# Patient Record
Sex: Male | Born: 1975 | Race: White | Hispanic: No | Marital: Married | Smoking: Never smoker
Health system: Southern US, Community
[De-identification: ages and names within clinical notes are randomized; demographics above are authoritative.]

## PROBLEM LIST (undated history)

## (undated) DIAGNOSIS — I10 Essential (primary) hypertension: Secondary | ICD-10-CM

---

## 2014-05-31 ENCOUNTER — Ambulatory Visit: Payer: Self-pay | Admitting: Internal Medicine

## 2014-05-31 LAB — COMPREHENSIVE METABOLIC PANEL
ALK PHOS: 70 U/L
ALT: 61 U/L
ANION GAP: 10 (ref 7–16)
Albumin: 4.1 g/dL (ref 3.4–5.0)
BILIRUBIN TOTAL: 0.6 mg/dL (ref 0.2–1.0)
BUN: 11 mg/dL (ref 7–18)
CALCIUM: 9.4 mg/dL (ref 8.5–10.1)
CREATININE: 1.02 mg/dL (ref 0.60–1.30)
Chloride: 103 mmol/L (ref 98–107)
Co2: 28 mmol/L (ref 21–32)
Glucose: 120 mg/dL — ABNORMAL HIGH (ref 65–99)
Osmolality: 282 (ref 275–301)
Potassium: 3.8 mmol/L (ref 3.5–5.1)
SGOT(AST): 23 U/L (ref 15–37)
SODIUM: 141 mmol/L (ref 136–145)
TOTAL PROTEIN: 8.1 g/dL (ref 6.4–8.2)

## 2014-05-31 LAB — LIPASE, BLOOD: LIPASE: 96 U/L (ref 73–393)

## 2014-05-31 LAB — CBC WITH DIFFERENTIAL/PLATELET
BASOS ABS: 0.1 10*3/uL (ref 0.0–0.1)
BASOS PCT: 0.9 %
EOS ABS: 0 10*3/uL (ref 0.0–0.7)
Eosinophil %: 0.4 %
HCT: 49.3 % (ref 40.0–52.0)
HGB: 16.5 g/dL (ref 13.0–18.0)
LYMPHS ABS: 2 10*3/uL (ref 1.0–3.6)
LYMPHS PCT: 18 %
MCH: 29.1 pg (ref 26.0–34.0)
MCHC: 33.5 g/dL (ref 32.0–36.0)
MCV: 87 fL (ref 80–100)
Monocyte #: 0.7 x10 3/mm (ref 0.2–1.0)
Monocyte %: 6.5 %
NEUTROS PCT: 74.2 %
Neutrophil #: 8.4 10*3/uL — ABNORMAL HIGH (ref 1.4–6.5)
Platelet: 258 10*3/uL (ref 150–440)
RBC: 5.67 10*6/uL (ref 4.40–5.90)
RDW: 12.9 % (ref 11.5–14.5)
WBC: 11.3 10*3/uL — ABNORMAL HIGH (ref 3.8–10.6)

## 2014-05-31 LAB — AMYLASE: Amylase: 52 U/L (ref 25–115)

## 2018-05-06 ENCOUNTER — Emergency Department
Admission: EM | Admit: 2018-05-06 | Discharge: 2018-05-06 | Disposition: A | Discharge status: Home / Self Care | Payer: 59 | Attending: Emergency Medicine | Admitting: Emergency Medicine

## 2018-05-06 ENCOUNTER — Emergency Department: Payer: 59

## 2018-05-06 ENCOUNTER — Encounter: Payer: Self-pay | Admitting: Emergency Medicine

## 2018-05-06 ENCOUNTER — Other Ambulatory Visit: Payer: Self-pay

## 2018-05-06 DIAGNOSIS — F17200 Nicotine dependence, unspecified, uncomplicated: Secondary | ICD-10-CM | POA: Diagnosis not present

## 2018-05-06 DIAGNOSIS — I10 Essential (primary) hypertension: Secondary | ICD-10-CM | POA: Diagnosis not present

## 2018-05-06 DIAGNOSIS — N23 Unspecified renal colic: Secondary | ICD-10-CM | POA: Diagnosis not present

## 2018-05-06 DIAGNOSIS — R1032 Left lower quadrant pain: Secondary | ICD-10-CM | POA: Diagnosis present

## 2018-05-06 HISTORY — DX: Essential (primary) hypertension: I10

## 2018-05-06 LAB — URINALYSIS, COMPLETE (UACMP) WITH MICROSCOPIC
Bilirubin Urine: NEGATIVE
KETONES UR: 80 mg/dL — AB
Leukocytes, UA: NEGATIVE
Nitrite: NEGATIVE
PROTEIN: NEGATIVE mg/dL
Specific Gravity, Urine: 1.014 (ref 1.005–1.030)
pH: 6 (ref 5.0–8.0)

## 2018-05-06 LAB — COMPREHENSIVE METABOLIC PANEL
ALT: 48 U/L — ABNORMAL HIGH (ref 0–44)
ANION GAP: 11 (ref 5–15)
AST: 38 U/L (ref 15–41)
Albumin: 4 g/dL (ref 3.5–5.0)
Alkaline Phosphatase: 60 U/L (ref 38–126)
BUN: 13 mg/dL (ref 6–20)
CHLORIDE: 104 mmol/L (ref 98–111)
CO2: 23 mmol/L (ref 22–32)
Calcium: 8.9 mg/dL (ref 8.9–10.3)
Creatinine, Ser: 0.78 mg/dL (ref 0.61–1.24)
GFR calc non Af Amer: >60 mL/min (ref >60)
Glucose, Bld: 262 mg/dL — ABNORMAL HIGH (ref 70–99)
POTASSIUM: 4 mmol/L (ref 3.5–5.1)
SODIUM: 138 mmol/L (ref 135–145)
Total Bilirubin: 1 mg/dL (ref 0.3–1.2)
Total Protein: 7.5 g/dL (ref 6.5–8.1)

## 2018-05-06 LAB — CBC WITH DIFFERENTIAL/PLATELET
ABS IMMATURE GRANULOCYTES: 0.11 10*3/uL — AB (ref 0.00–0.07)
Basophils Absolute: 0.1 10*3/uL (ref 0.0–0.1)
Basophils Relative: 0 %
Eosinophils Absolute: 0 10*3/uL (ref 0.0–0.5)
Eosinophils Relative: 0 %
HEMATOCRIT: 49 % (ref 39.0–52.0)
HEMOGLOBIN: 17 g/dL (ref 13.0–17.0)
Immature Granulocytes: 1 %
LYMPHS ABS: 1.1 10*3/uL (ref 0.7–4.0)
LYMPHS PCT: 7 %
MCH: 29.2 pg (ref 26.0–34.0)
MCHC: 34.7 g/dL (ref 30.0–36.0)
MCV: 84 fL (ref 80.0–100.0)
MONO ABS: 0.4 10*3/uL (ref 0.1–1.0)
MONOS PCT: 2 %
NEUTROS ABS: 14.5 10*3/uL — AB (ref 1.7–7.7)
NEUTROS PCT: 90 %
NRBC: 0 % (ref 0.0–0.2)
Platelets: 347 10*3/uL (ref 150–400)
RBC: 5.83 MIL/uL — AB (ref 4.22–5.81)
RDW: 12.1 % (ref 11.5–15.5)
WBC: 16.2 10*3/uL — ABNORMAL HIGH (ref 4.0–10.5)

## 2018-05-06 LAB — LIPASE, BLOOD: LIPASE: 28 U/L (ref 11–51)

## 2018-05-06 MED ORDER — OXYCODONE-ACETAMINOPHEN 5-325 MG PO TABS: 1.0000 | ORAL_TABLET | ORAL | 0 refills | Status: AC | PRN

## 2018-05-06 MED ORDER — MORPHINE SULFATE (PF) 4 MG/ML IV SOLN
4.0000 mg | Freq: Once | INTRAVENOUS | Status: AC
  Administered 2018-05-06: 4 mg via INTRAVENOUS

## 2018-05-06 MED ORDER — SODIUM CHLORIDE 0.9 % IV BOLUS
1000.0000 mL | Freq: Once | INTRAVENOUS | Status: AC
  Administered 2018-05-06: 1000 mL via INTRAVENOUS

## 2018-05-06 MED ORDER — ONDANSETRON HCL 4 MG/2ML IJ SOLN
INTRAMUSCULAR | Status: AC
  Filled 2018-05-06: qty 2

## 2018-05-06 MED ORDER — ONDANSETRON HCL 4 MG/2ML IJ SOLN
4.0000 mg | Freq: Once | INTRAMUSCULAR | Status: AC
  Administered 2018-05-06: 4 mg via INTRAVENOUS

## 2018-05-06 MED ORDER — MORPHINE SULFATE (PF) 4 MG/ML IV SOLN
INTRAVENOUS | Status: AC
  Filled 2018-05-06: qty 1

## 2018-05-06 NOTE — ED Triage Notes (Signed)
Pt presents to ED via [facility] with c/o L flank pain that started this morning. Pt denies hx of kidney stone. Per EMS pt attempted to go POV to hospital due to pain, per EMS pt had syncopal episode in front of [facility], pt was immediately arrousable. EMS also reports frequent episodes of hyperventilation

## 2018-05-06 NOTE — Discharge Instructions (Addendum)
Please call Dr. Apolinar Junes the urologist to arrange follow-up later on this week or next week.  Please return here for fever vomiting or much worse pain.  You can use the Percocet 1 pill 4 times a day as needed for pain or if need be go up to 2 pills 4 times a day.  Be careful it can make you woozy.  Do not drive on it.  Can also make you constipated.  Eat plenty of fiber and lots of fluid.

## 2018-05-06 NOTE — ED Provider Notes (Signed)
[facility] Emergency Department Provider Note   ____________________________________________   First MD Initiated Contact with Patient 05/06/18 1512     (approximate)  I have reviewed the triage vital signs and the nursing notes.   HISTORY  Chief Complaint Flank Pain and Loss of Consciousness   HPI Drew Dennis. is a 42 y.o. male reports left flank pain starting this morning radiating around toward the groin.  No dysuria.  Pain is worse with movement or deep breathing.  Is also worse with question of the left CVA area.  Patient reportedly passed out in front of neb in urgent care but EMS reports he woke up easily immediately.  Patient does look somewhat pale and his breathing somewhat fast.  Pain is severe to moderately severe deep and achy.   Past Medical History:  Diagnosis Date  . Hypertension     There are no active problems to display for this patient.   History reviewed. No pertinent surgical history.  Prior to Admission medications   Medication Sig Start Date End Date Taking? Authorizing Provider  oxyCODONE-acetaminophen (PERCOCET) 5-325 MG tablet Take 1 tablet by mouth every 4 (four) hours as needed for severe pain. 05/06/18 05/06/19  Arnaldo Natal, MD    Allergies Sudafed [pseudoephedrine]  History reviewed. No pertinent family history.  Social History Social History   Tobacco Use  . Smoking status: Never Smoker  . Smokeless tobacco: Former Engineer, water Use Topics  . Alcohol use: Not Currently  . Drug use: Never    Review of Systems  Constitutional: No fever/chills Eyes: No visual changes. ENT: No sore throat. Cardiovascular: Denies chest pain. Respiratory: Denies shortness of breath. Gastrointestinal: No abdominal pain.  No nausea, no vomiting.  No diarrhea.  No constipation. Genitourinary: Negative for dysuria. Musculoskeletal:back pain/flank pain. Skin: Negative for rash. Neurological: Negative for  headaches, focal weakness   ____________________________________________   PHYSICAL EXAM:  VITAL SIGNS: ED Triage Vitals  Enc Vitals Group     BP 05/06/18 1510 (!) 149/82     Pulse Rate 05/06/18 1510 67     Resp 05/06/18 1510 15     Temp 05/06/18 1510 98.5 F (36.9 C)     Temp Source 05/06/18 1510 Oral     SpO2 05/06/18 1510 100 %     Weight 05/06/18 1508 (!) 320 lb (145.2 kg)     Height 05/06/18 1508 5\' 10"  (1.778 m)     Head Circumference --      Peak Flow --      Pain Score 05/06/18 1507 10     Pain Loc --      Pain Edu? --    Constitutional: Alert and oriented.  Looks somewhat pale uncomfortable breathing fast Eyes: Conjunctivae are normal.  Head: Atraumatic. Nose: No congestion/rhinnorhea. Mouth/Throat: Mucous membranes are moist.  Oropharynx non-erythematous. Neck: No stridor.   Cardiovascular: Normal rate, regular rhythm. Grossly normal heart sounds.  Good peripheral circulation. Respiratory: Normal respiratory effort.  No retractions. Lungs CTAB. Gastrointestinal: Soft and nontender. No distention. No abdominal bruits.  Right CVA tenderness. Musculoskeletal: No lower extremity tenderness nor edema.   Neurologic:  Normal speech and language. No gross focal neurologic deficits are appreciated. No gait instability. Skin:  Skin is warm, dry and intact. No rash noted. Psychiatric: Mood and affect are normal. Speech and behavior are normal.  ____________________________________________   LABS (all labs ordered are listed, but only abnormal results are displayed)  Labs Reviewed  CBC WITH  DIFFERENTIAL/PLATELET - Abnormal; Notable for the following components:      Result Value   WBC 16.2 (*)    RBC 5.83 (*)    Neutro Abs 14.5 (*)    Abs Immature Granulocytes 0.11 (*)    All other components within normal limits  URINALYSIS, COMPLETE (UACMP) WITH MICROSCOPIC - Abnormal; Notable for the following components:   Color, Urine YELLOW (*)    APPearance CLEAR (*)     Glucose, UA >=500 (*)    Hgb urine dipstick LARGE (*)    Ketones, ur 80 (*)    RBC / HPF >50 (*)    Bacteria, UA RARE (*)    All other components within normal limits  COMPREHENSIVE METABOLIC PANEL - Abnormal; Notable for the following components:   Glucose, Bld 262 (*)    ALT 48 (*)    All other components within normal limits  LIPASE, BLOOD   ____________________________________________  EKG  EKG read and interpreted by me shows normal sinus rhythm rate of 69 normal axis no acute ST-T changes ____________________________________________  RADIOLOGY  ED MD interpretation:   Official radiology report(s): Ct Renal Stone Study  Result Date: 05/06/2018 CLINICAL DATA:  Left flank pain starting this morning. EXAM: CT ABDOMEN AND PELVIS WITHOUT CONTRAST TECHNIQUE: Multidetector CT imaging of the abdomen and pelvis was performed following the standard protocol without IV contrast. COMPARISON:  None. FINDINGS: Lower chest: Unremarkable Hepatobiliary: Diffuse hepatic steatosis. Pancreas: Unremarkable Spleen: Unremarkable Adrenals/Urinary Tract: Mild left hydronephrosis and left hydroureter due to a 2 mm calculus in the intramural portion of the left ureterovesical junction. 1-2 mm right kidney lower pole nonobstructive renal calculus. 2 mm left kidney lower pole nonobstructive renal calculus. 2 mm left kidney upper pole nonobstructive renal calculus. Stomach/Bowel: There are a few sigmoid colon diverticula. Vascular/Lymphatic: Unremarkable Reproductive: Unremarkable Other: No supplemental non-categorized findings. Musculoskeletal: Lumbar spondylosis and degenerative disc disease with borderline foraminal impingement at the L4-5 and L5-S1 levels. IMPRESSION: 1. Mild left hydronephrosis and hydroureter due to a 2 mm calculus in the intramural portion of the left UVJ. 2. Single right and 2 left tiny nonobstructive renal calculi are also present. 3. Diffuse hepatic steatosis. Lower lumbar spondylosis  and degenerative disc disease. Electronically Signed   By: Gaylyn Rong M.D.   On: 05/06/2018 16:18    ____________________________________________   PROCEDURES  Procedure(s) performed:   Procedures  Critical Care performed:   ____________________________________________   INITIAL IMPRESSION / ASSESSMENT AND PLAN / ED COURSE  Patient feels better after pain meds.  He does have a kidney stone.  Urine shows only hematuria no sign of any infection.  We will let him go and have him follow-up with urology.        ____________________________________________   FINAL CLINICAL IMPRESSION(S) / ED DIAGNOSES  Final diagnoses:  Renal colic     ED Discharge Orders         Ordered    oxyCODONE-acetaminophen (PERCOCET) 5-325 MG tablet  Every 4 hours PRN     05/06/18 1858           Note:  This document was prepared using Dragon voice recognition software and may include unintentional dictation errors.    Arnaldo Natal, MD 05/06/18 2102

## 2019-09-04 IMAGING — CT CT RENAL STONE PROTOCOL
2 of 4 series · 16 of 46 positions shown, 18 images · non-contrast
Comparison: None.

CLINICAL DATA: Left flank pain starting this morning.

EXAM:
CT ABDOMEN AND PELVIS WITHOUT CONTRAST
TECHNIQUE: Multidetector CT imaging of the abdomen and pelvis was performed
following the standard protocol without IV contrast.

[Series 2: stone full standard · axial · 0.89mm/px · z∈[-1045,-550]mm · 13 of 109 slices shown, 15 images]
[im 5/109  soft-tissue]
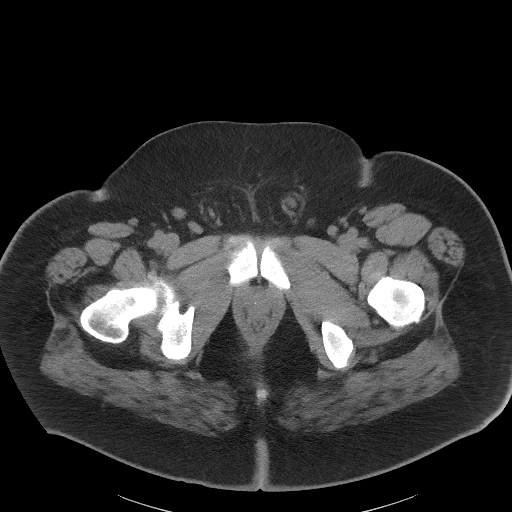
[im 5/109  bone]
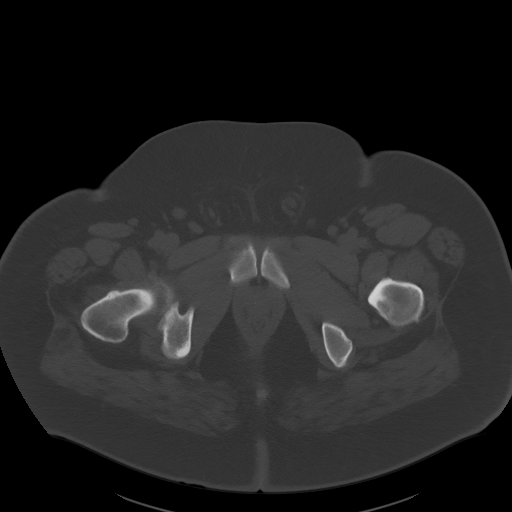
[im 15/109  soft-tissue]
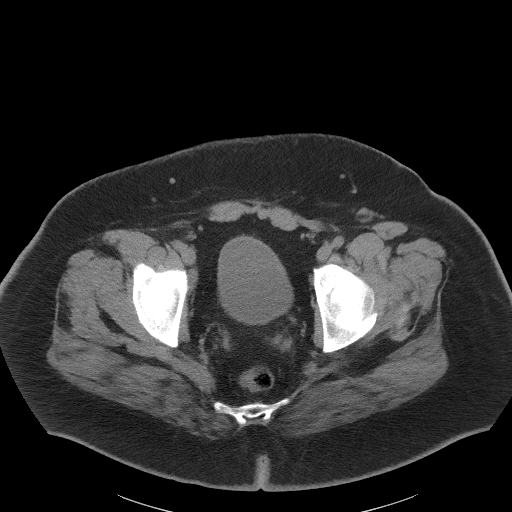
[im 24/109  soft-tissue]
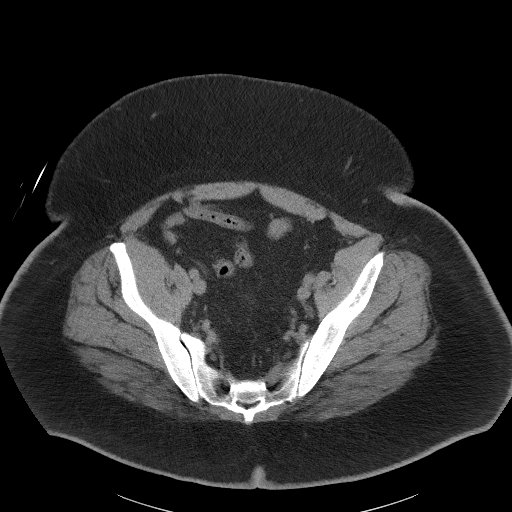
[im 29/109  soft-tissue]
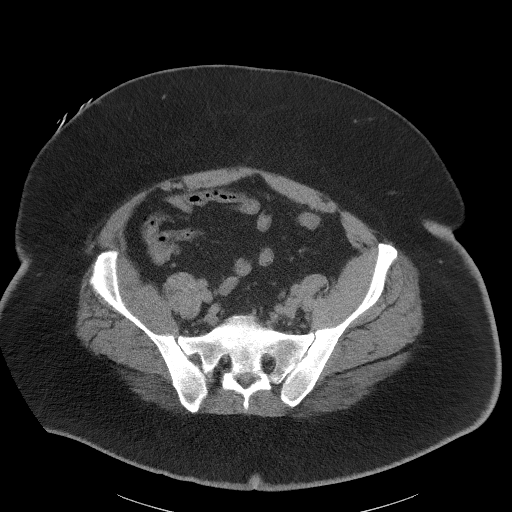
[im 38/109  soft-tissue]
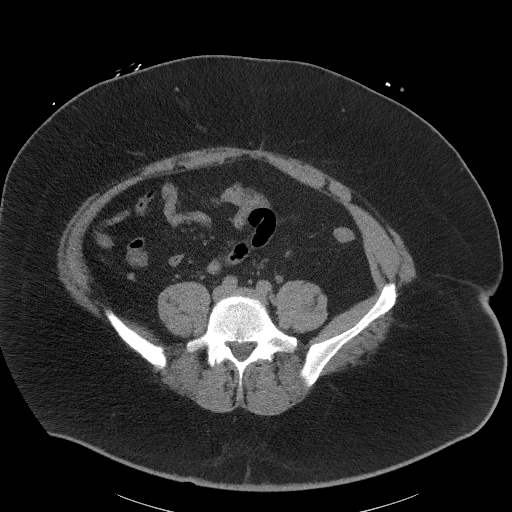
[im 47/109  soft-tissue]
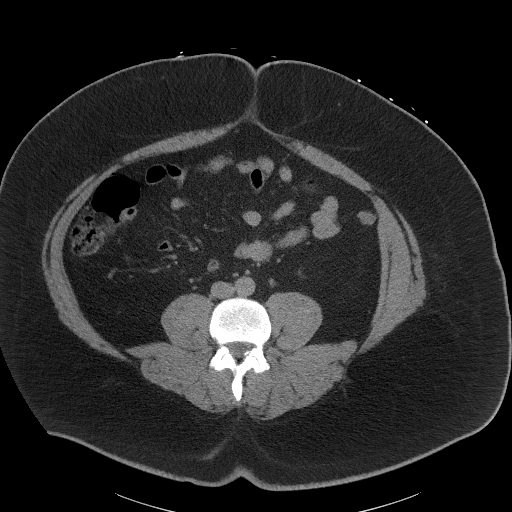
[im 57/109  soft-tissue]
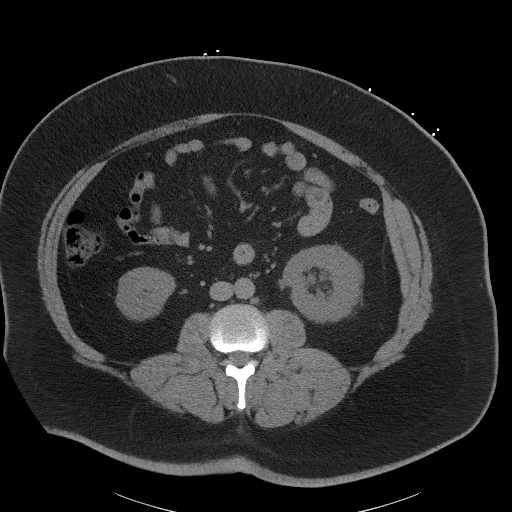
[im 62/109  soft-tissue]
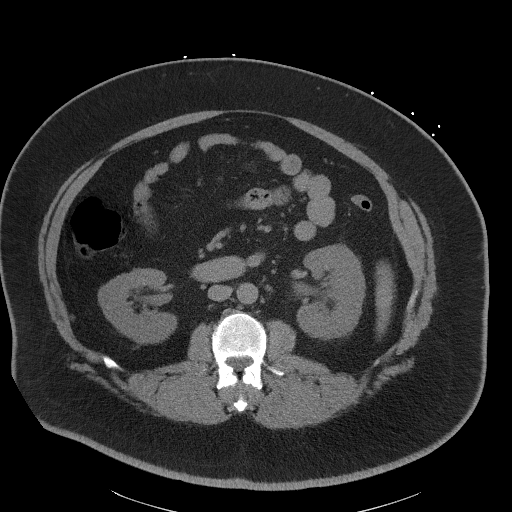
[im 71/109  soft-tissue]
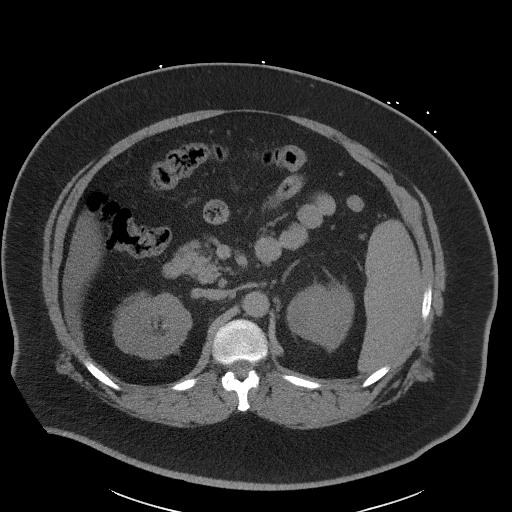
[im 71/109  bone]
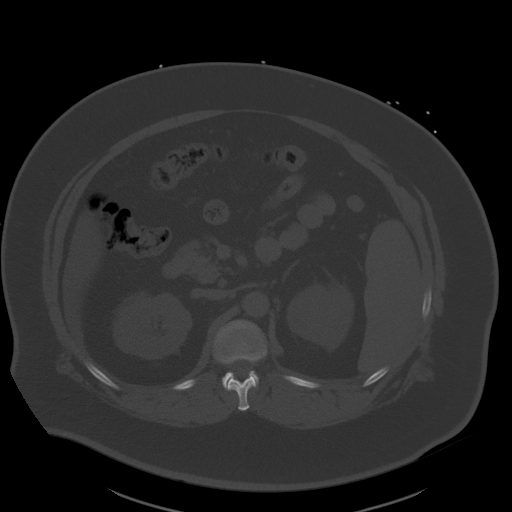
[im 80/109  soft-tissue]
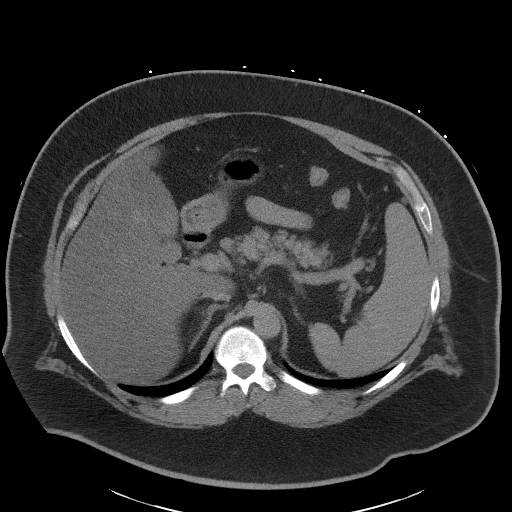
[im 85/109  soft-tissue]
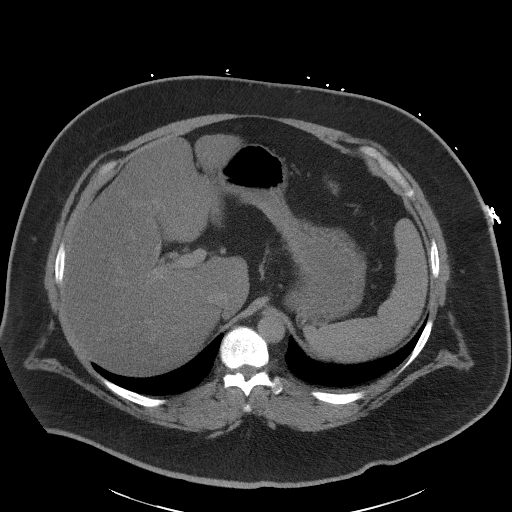
[im 94/109  soft-tissue]
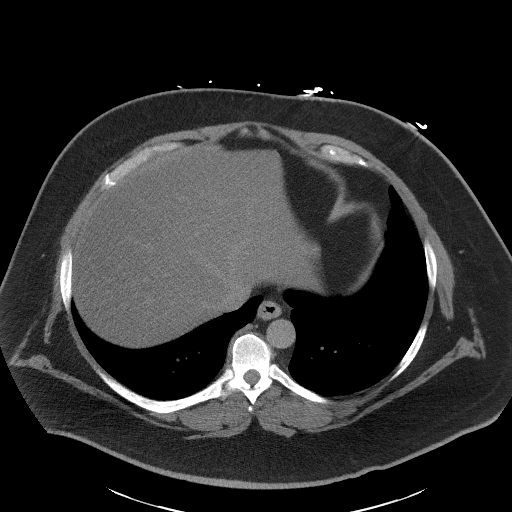
[im 104/109  soft-tissue]
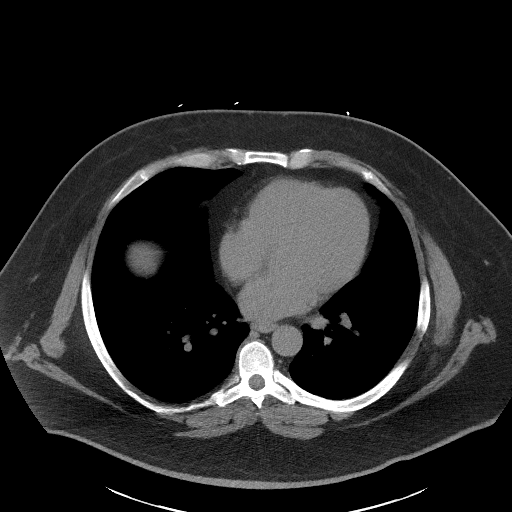

[Series 5: coronal · coronal · 0.84mm/px · 3 of 178 slices shown]
[im 60/178  soft-tissue]
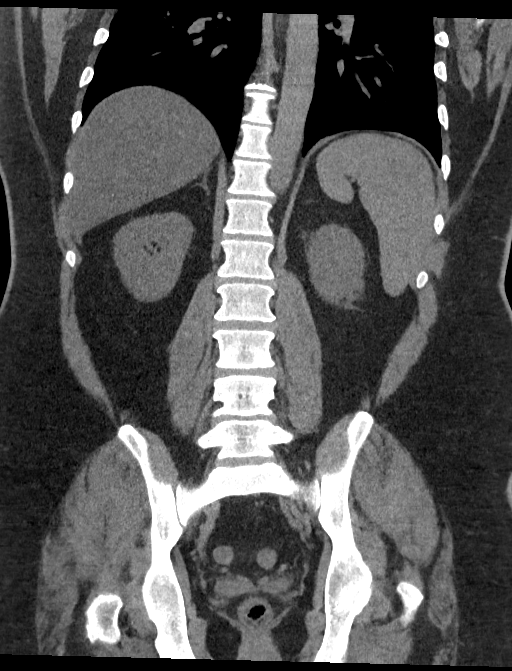
[im 79/178  soft-tissue]
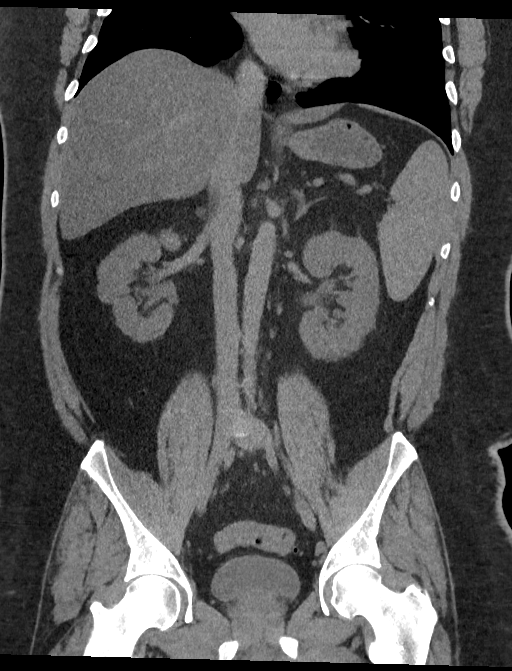
[im 99/178  soft-tissue]
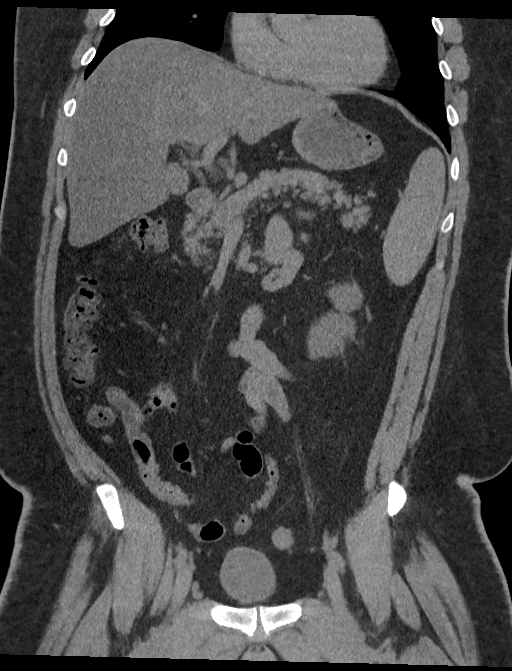

[16 of 46 positions shown; findings below may reference images not displayed]

FINDINGS: Lower chest: Unremarkable

Hepatobiliary: Diffuse hepatic steatosis.

Pancreas: Unremarkable

Spleen: Unremarkable

Adrenals/Urinary Tract: Mild left hydronephrosis and left
hydroureter due to a 2 mm calculus in the intramural portion of the
left ureterovesical junction.

1-2 mm right kidney lower pole nonobstructive renal calculus. 2 mm
left kidney lower pole nonobstructive renal calculus. 2 mm left
kidney upper pole nonobstructive renal calculus.

Stomach/Bowel: There are a few sigmoid colon diverticula.

Vascular/Lymphatic: Unremarkable

Reproductive: Unremarkable

Other: No supplemental non-categorized findings.

Musculoskeletal: Lumbar spondylosis and degenerative disc disease
with borderline foraminal impingement at the L4-5 and L5-S1 levels.
IMPRESSION: 1. Mild left hydronephrosis and hydroureter due to a 2 mm calculus
in the intramural portion of the left UVJ.
2. Single right and 2 left tiny nonobstructive renal calculi are
also present.
3. Diffuse hepatic steatosis. Lower lumbar spondylosis and
degenerative disc disease.
